# Patient Record
Sex: Female | Born: 2000 | Race: Asian | Hispanic: No | Marital: Single | Smoking: Never smoker
Health system: Southern US, Community
[De-identification: ages and names within clinical notes are randomized; demographics above are authoritative.]

## PROBLEM LIST (undated history)

## (undated) DIAGNOSIS — S42009A Fracture of unspecified part of unspecified clavicle, initial encounter for closed fracture: Secondary | ICD-10-CM

---
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
## 2019-06-13 ENCOUNTER — Other Ambulatory Visit: Payer: Self-pay

## 2019-06-13 ENCOUNTER — Observation Stay
Admission: EM | Admit: 2019-06-13 | Discharge: 2019-06-15 | Disposition: A | Discharge status: Home / Self Care | Payer: BC Managed Care – PPO | Attending: General Surgery | Admitting: General Surgery

## 2019-06-13 DIAGNOSIS — K3533 Acute appendicitis with perforation and localized peritonitis, with abscess: Secondary | ICD-10-CM | POA: Diagnosis not present

## 2019-06-13 DIAGNOSIS — R1031 Right lower quadrant pain: Secondary | ICD-10-CM

## 2019-06-13 DIAGNOSIS — Z20828 Contact with and (suspected) exposure to other viral communicable diseases: Secondary | ICD-10-CM | POA: Insufficient documentation

## 2019-06-13 DIAGNOSIS — R109 Unspecified abdominal pain: Secondary | ICD-10-CM | POA: Diagnosis present

## 2019-06-13 DIAGNOSIS — K3580 Unspecified acute appendicitis: Secondary | ICD-10-CM | POA: Diagnosis present

## 2019-06-13 NOTE — ED Triage Notes (Signed)
Patient states that she has had abdominal pain since 8am today.

## 2019-06-14 ENCOUNTER — Other Ambulatory Visit: Payer: Self-pay

## 2019-06-14 ENCOUNTER — Encounter
Admission: EM | Disposition: A | Discharge status: Home / Self Care | Payer: Self-pay | Source: Home / Self Care | Attending: Emergency Medicine

## 2019-06-14 ENCOUNTER — Encounter: Payer: Self-pay | Admitting: Radiology

## 2019-06-14 ENCOUNTER — Emergency Department: Payer: BC Managed Care – PPO

## 2019-06-14 ENCOUNTER — Observation Stay: Payer: BC Managed Care – PPO | Admitting: Anesthesiology

## 2019-06-14 DIAGNOSIS — K3580 Unspecified acute appendicitis: Secondary | ICD-10-CM | POA: Diagnosis present

## 2019-06-14 HISTORY — PX: LAPAROSCOPIC APPENDECTOMY: SHX408

## 2019-06-14 LAB — COMPREHENSIVE METABOLIC PANEL
ALT: 12 U/L (ref 0–44)
AST: 19 U/L (ref 15–41)
Albumin: 4.1 g/dL (ref 3.5–5.0)
Alkaline Phosphatase: 85 U/L (ref 38–126)
Anion gap: 10 (ref 5–15)
BUN: 8 mg/dL (ref 6–20)
CO2: 24 mmol/L (ref 22–32)
Calcium: 9.6 mg/dL (ref 8.9–10.3)
Chloride: 103 mmol/L (ref 98–111)
Creatinine, Ser: 0.56 mg/dL (ref 0.44–1.00)
GFR calc Af Amer: >60 mL/min (ref >60)
GFR calc non Af Amer: >60 mL/min (ref >60)
Glucose, Bld: 108 mg/dL — ABNORMAL HIGH (ref 70–99)
Potassium: 3.6 mmol/L (ref 3.5–5.1)
Sodium: 137 mmol/L (ref 135–145)
Total Bilirubin: 0.5 mg/dL (ref 0.3–1.2)
Total Protein: 8 g/dL (ref 6.5–8.1)

## 2019-06-14 LAB — CBC WITH DIFFERENTIAL/PLATELET
Abs Immature Granulocytes: 0.03 10*3/uL (ref 0.00–0.07)
Basophils Absolute: 0.1 10*3/uL (ref 0.0–0.1)
Basophils Relative: 0 %
Eosinophils Absolute: 0.2 10*3/uL (ref 0.0–0.5)
Eosinophils Relative: 2 %
HCT: 41 % (ref 36.0–46.0)
Hemoglobin: 14 g/dL (ref 12.0–15.0)
Immature Granulocytes: 0 %
Lymphocytes Relative: 20 %
Lymphs Abs: 2.2 10*3/uL (ref 0.7–4.0)
MCH: 29.9 pg (ref 26.0–34.0)
MCHC: 34.1 g/dL (ref 30.0–36.0)
MCV: 87.4 fL (ref 80.0–100.0)
Monocytes Absolute: 0.7 10*3/uL (ref 0.1–1.0)
Monocytes Relative: 6 %
Neutro Abs: 7.9 10*3/uL — ABNORMAL HIGH (ref 1.7–7.7)
Neutrophils Relative %: 72 %
Platelets: 250 10*3/uL (ref 150–400)
RBC: 4.69 MIL/uL (ref 3.87–5.11)
RDW: 12.1 % (ref 11.5–15.5)
WBC: 11.2 10*3/uL — ABNORMAL HIGH (ref 4.0–10.5)
nRBC: 0 % (ref 0.0–0.2)

## 2019-06-14 LAB — URINALYSIS, COMPLETE (UACMP) WITH MICROSCOPIC
Bacteria, UA: NONE SEEN
Bilirubin Urine: NEGATIVE
Glucose, UA: NEGATIVE mg/dL
Hgb urine dipstick: NEGATIVE
Ketones, ur: NEGATIVE mg/dL
Leukocytes,Ua: NEGATIVE
Nitrite: NEGATIVE
Protein, ur: NEGATIVE mg/dL
Specific Gravity, Urine: 1.012 (ref 1.005–1.030)
pH: 6 (ref 5.0–8.0)

## 2019-06-14 LAB — LIPASE, BLOOD: Lipase: 24 U/L (ref 11–51)

## 2019-06-14 LAB — HCG, QUANTITATIVE, PREGNANCY: hCG, Beta Chain, Quant, S: <1 m[IU]/mL (ref <5)

## 2019-06-14 LAB — SARS CORONAVIRUS 2 BY RT PCR (HOSPITAL ORDER, PERFORMED IN ~~LOC~~ HOSPITAL LAB): SARS Coronavirus 2: NEGATIVE

## 2019-06-14 IMAGING — CT CT ABD-PELV W/ CM
2 of 4 series · 16 of 46 positions shown, 18 images · IV contrast (APPLIED)
Comparison: None.

CLINICAL DATA: Abdominal pain since 8 a.m.

EXAM:
CT ABDOMEN AND PELVIS WITH CONTRAST
TECHNIQUE: Multidetector CT imaging of the abdomen and pelvis was performed
using the standard protocol following bolus administration of
intravenous contrast.
CONTRAST:  100mL OMNIPAQUE IOHEXOL 300 MG/ML  SOLN

[Series 2: routine abd/pel with · axial · 0.74mm/px · z∈[-1127,-712]mm · 13 of 91 slices shown, 15 images]
[im 4/91  soft-tissue]
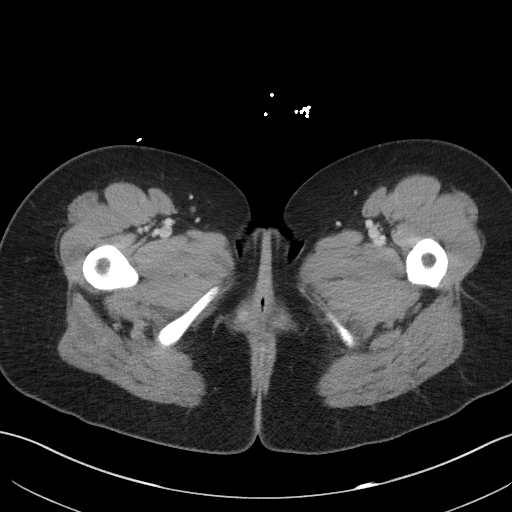
[im 4/91  bone]
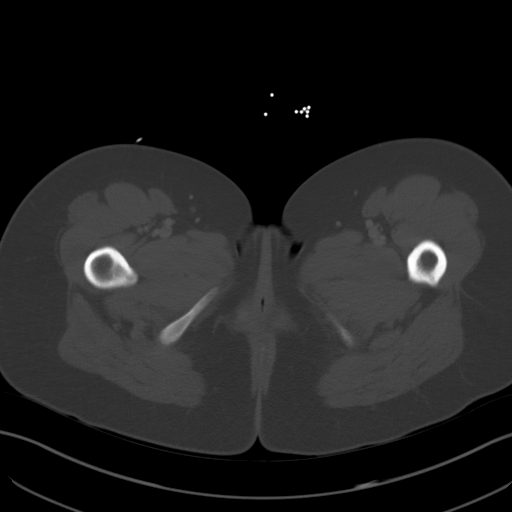
[im 12/91  soft-tissue]
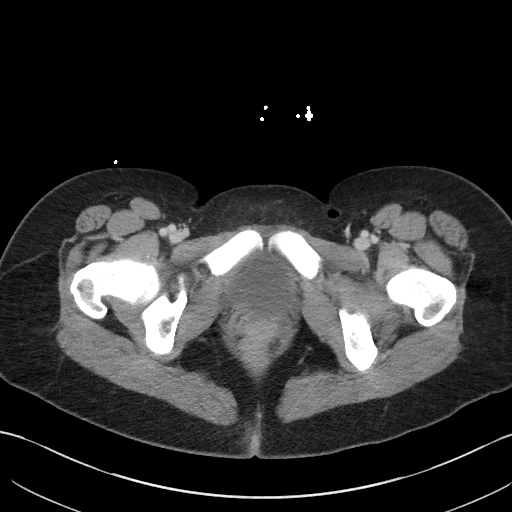
[im 19/91  soft-tissue]
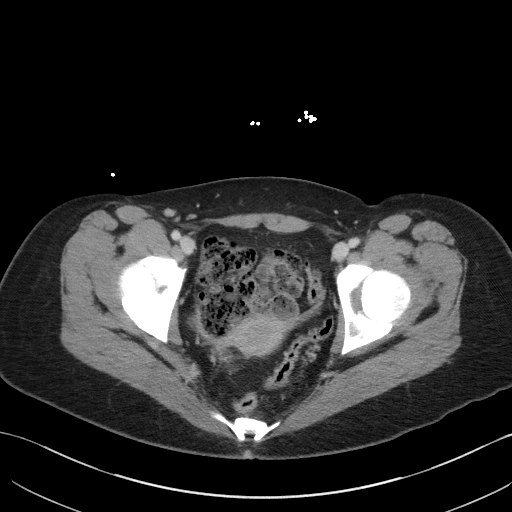
[im 27/91  soft-tissue]
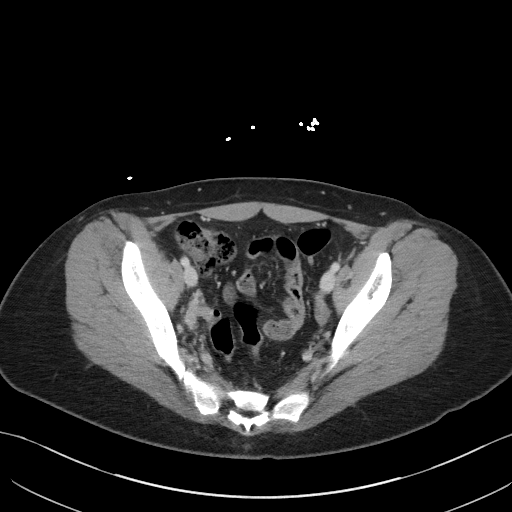
[im 31/91  soft-tissue]
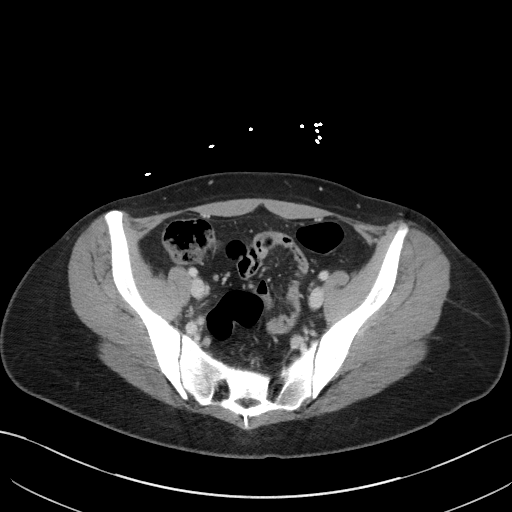
[im 38/91  soft-tissue]
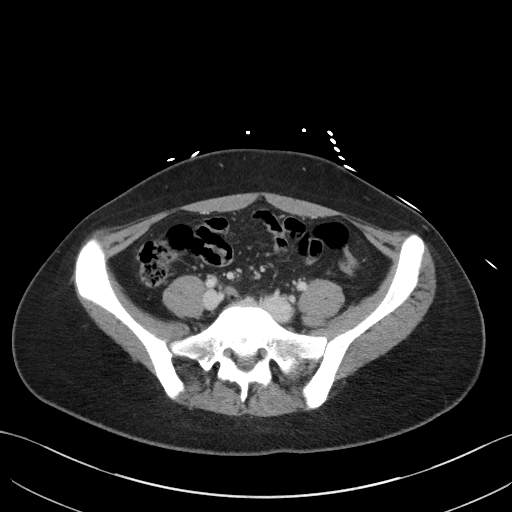
[im 46/91  soft-tissue]
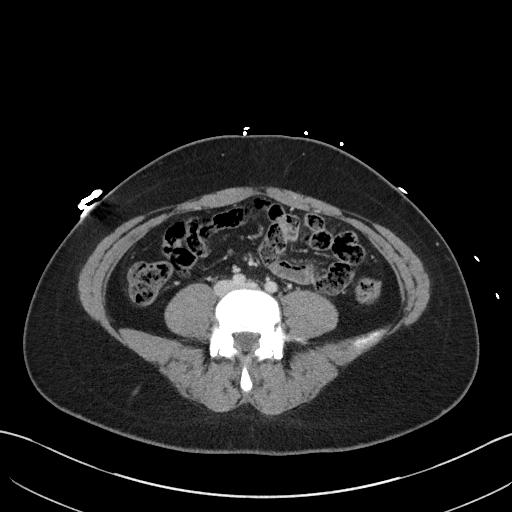
[im 53/91  soft-tissue]
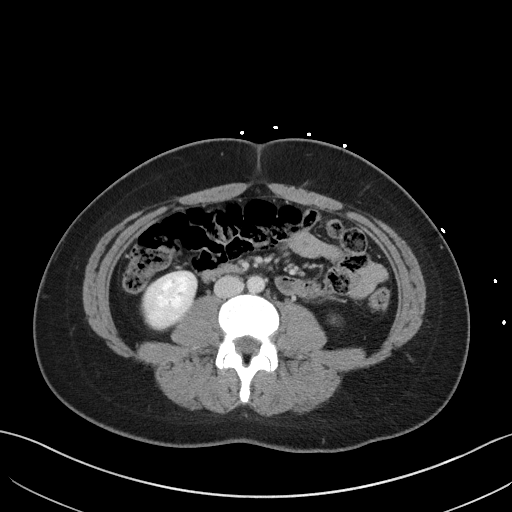
[im 61/91  soft-tissue]
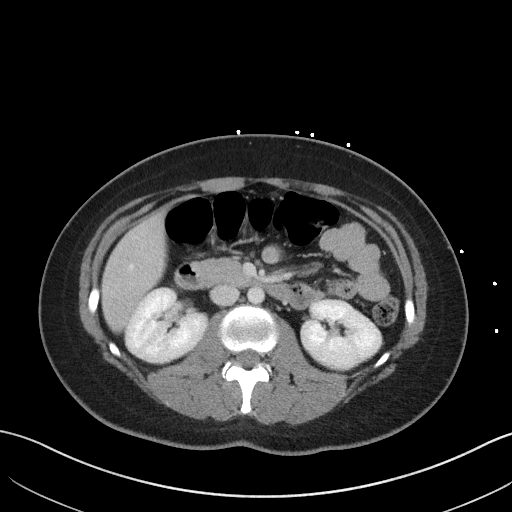
[im 61/91  bone]
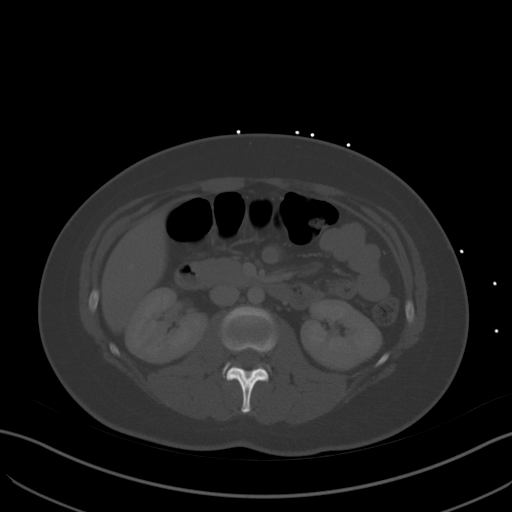
[im 64/91  soft-tissue]
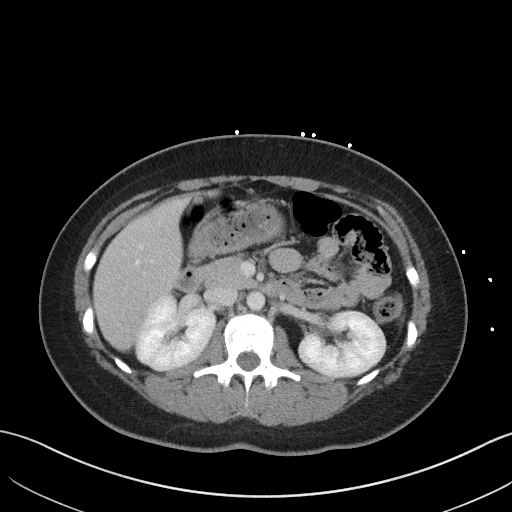
[im 72/91  soft-tissue]
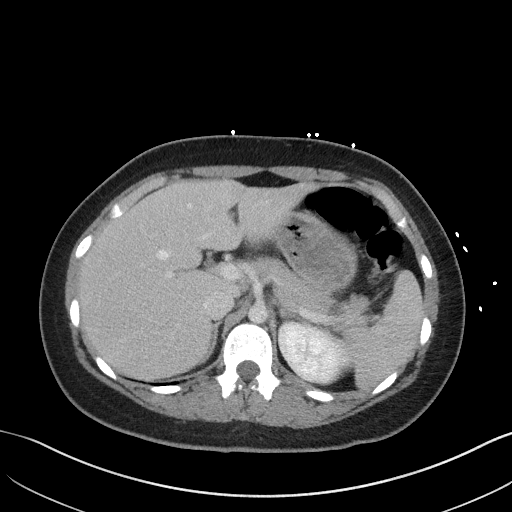
[im 79/91  soft-tissue]
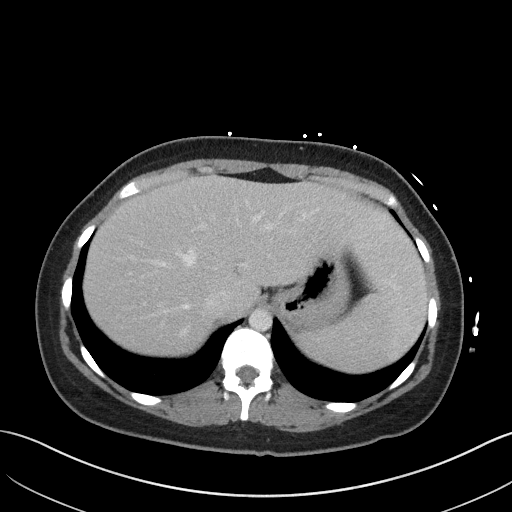
[im 87/91  soft-tissue]
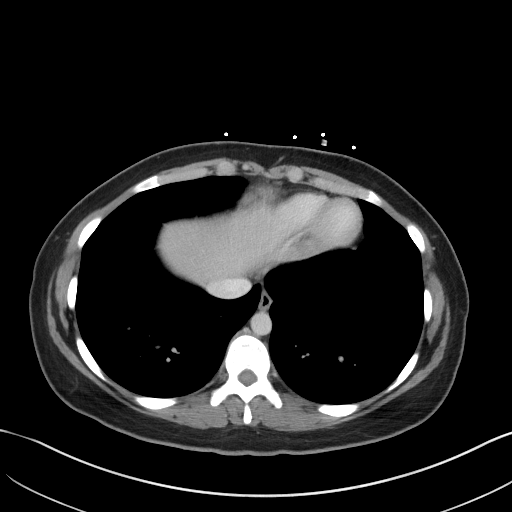

[Series 5: coronal st · coronal · 0.74mm/px · 3 of 79 slices shown]
[im 27/79  soft-tissue]
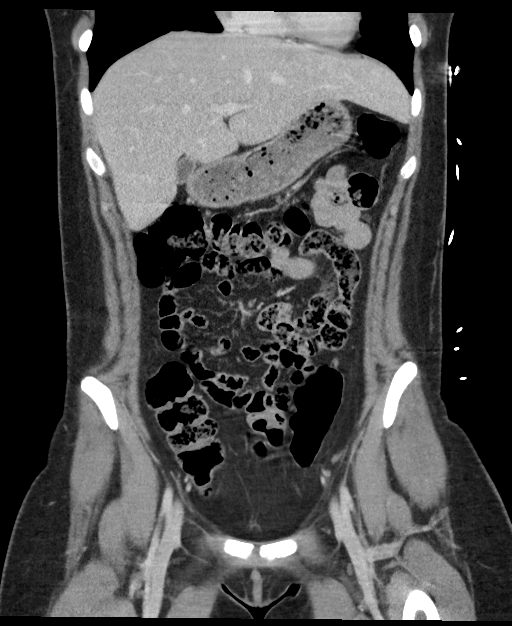
[im 35/79  soft-tissue]
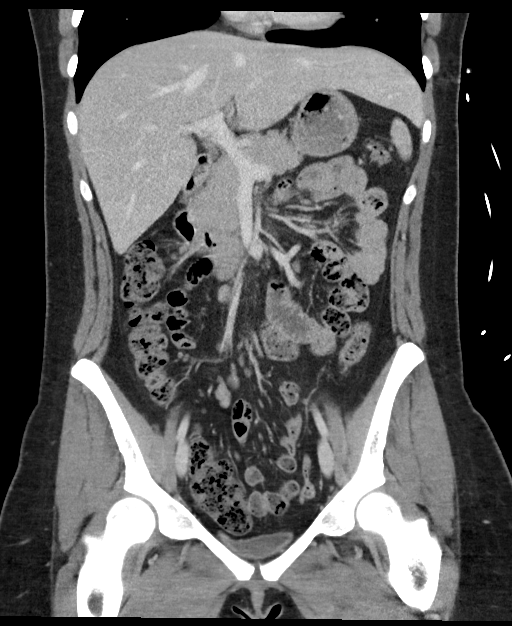
[im 44/79  soft-tissue]
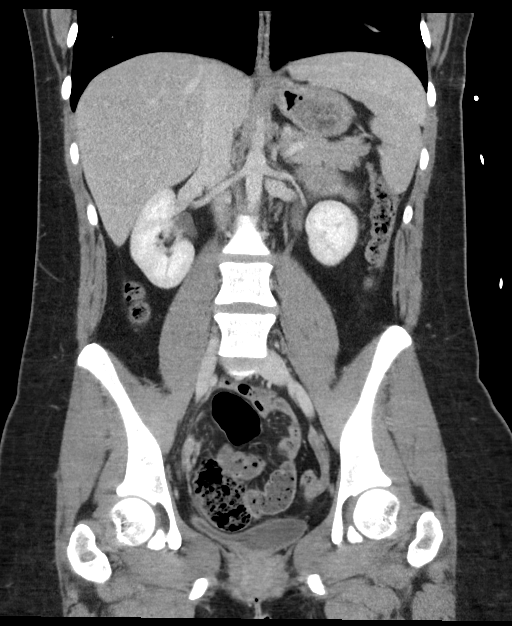

[16 of 46 positions shown; findings below may reference images not displayed]

FINDINGS: Lower chest: Lung bases are clear.

Hepatobiliary: No focal hepatic lesion. No biliary duct dilatation.
Gallbladder is normal. Common bile duct is normal.

Pancreas: Pancreas is normal. No ductal dilatation. No pancreatic
inflammation.

Spleen: Normal spleen

Adrenals/urinary tract: Adrenal glands and kidneys are normal. The
ureters and bladder normal.

Stomach/Bowel: Stomach, duodenum small-bowel normal. Cecum extends
into the RIGHT pelvis. Appendix difficult to identify but there is
no peri cecal inflammation.

The ascending, transverse and descending colon normal.

Vascular/Lymphatic: Abdominal aorta is normal caliber. No periportal
or retroperitoneal adenopathy. No pelvic adenopathy.

Reproductive: Uterus and ovaries normal.  No free fluid.

Other: No free fluid.

Musculoskeletal: No aggressive osseous lesion.
IMPRESSION: 1. No acute abdominopelvic findings identified.
2. While the appendix is not clearly identified, no pericecal
inflammation present. Cecum extends into the pelvis. Recommend
repeat exam if patient's symptoms worsen.

## 2019-06-14 SURGERY — APPENDECTOMY, LAPAROSCOPIC
Anesthesia: General

## 2019-06-14 MED ORDER — ONDANSETRON HCL 4 MG/2ML IJ SOLN
INTRAMUSCULAR | Status: AC
  Filled 2019-06-14: qty 2

## 2019-06-14 MED ORDER — LIDOCAINE HCL (PF) 2 % IJ SOLN
INTRAMUSCULAR | Status: AC
  Filled 2019-06-14: qty 10

## 2019-06-14 MED ORDER — MIDAZOLAM HCL 2 MG/2ML IJ SOLN
INTRAMUSCULAR | Status: DC | PRN
  Administered 2019-06-14: 2 mg via INTRAVENOUS

## 2019-06-14 MED ORDER — PIPERACILLIN-TAZOBACTAM 3.375 G IVPB 30 MIN
3.3750 g | Freq: Once | INTRAVENOUS | Status: AC
  Administered 2019-06-14: 3.375 g via INTRAVENOUS
  Filled 2019-06-14: qty 50

## 2019-06-14 MED ORDER — DEXAMETHASONE SODIUM PHOSPHATE 10 MG/ML IJ SOLN
INTRAMUSCULAR | Status: AC
  Filled 2019-06-14: qty 1

## 2019-06-14 MED ORDER — PROPOFOL 10 MG/ML IV BOLUS
INTRAVENOUS | Status: DC | PRN
  Administered 2019-06-14: 140 mg via INTRAVENOUS

## 2019-06-14 MED ORDER — LIDOCAINE-EPINEPHRINE (PF) 1 %-1:200000 IJ SOLN
INTRAMUSCULAR | Status: AC
  Filled 2019-06-14: qty 30

## 2019-06-14 MED ORDER — KETOROLAC TROMETHAMINE 30 MG/ML IJ SOLN
INTRAMUSCULAR | Status: AC
  Filled 2019-06-14: qty 1

## 2019-06-14 MED ORDER — BUPIVACAINE HCL (PF) 0.25 % IJ SOLN
INTRAMUSCULAR | Status: AC
  Filled 2019-06-14: qty 30

## 2019-06-14 MED ORDER — SIMETHICONE 80 MG PO CHEW
40.0000 mg | CHEWABLE_TABLET | Freq: Four times a day (QID) | ORAL | Status: DC | PRN
  Filled 2019-06-14: qty 1

## 2019-06-14 MED ORDER — ACETAMINOPHEN 325 MG PO TABS: 650.0000 mg | ORAL_TABLET | Freq: Four times a day (QID) | ORAL | Status: DC | PRN

## 2019-06-14 MED ORDER — FENTANYL CITRATE (PF) 250 MCG/5ML IJ SOLN
INTRAMUSCULAR | Status: AC
  Filled 2019-06-14: qty 5

## 2019-06-14 MED ORDER — SUGAMMADEX SODIUM 200 MG/2ML IV SOLN
INTRAVENOUS | Status: DC | PRN
  Administered 2019-06-14: 300 mg via INTRAVENOUS

## 2019-06-14 MED ORDER — DEXMEDETOMIDINE HCL 200 MCG/2ML IV SOLN
INTRAVENOUS | Status: DC | PRN
  Administered 2019-06-14: 8 ug via INTRAVENOUS
  Administered 2019-06-14: 4 ug via INTRAVENOUS
  Administered 2019-06-14: 8 ug via INTRAVENOUS

## 2019-06-14 MED ORDER — LIDOCAINE HCL (CARDIAC) PF 100 MG/5ML IV SOSY
PREFILLED_SYRINGE | INTRAVENOUS | Status: DC | PRN
  Administered 2019-06-14: 80 mg via INTRAVENOUS

## 2019-06-14 MED ORDER — HYDROMORPHONE HCL 1 MG/ML IJ SOLN
0.5000 mg | INTRAMUSCULAR | Status: DC | PRN
  Administered 2019-06-14 – 2019-06-15 (×4): 0.5 mg via INTRAVENOUS
  Filled 2019-06-14 (×4): qty 0.5

## 2019-06-14 MED ORDER — KETOROLAC TROMETHAMINE 30 MG/ML IJ SOLN
INTRAMUSCULAR | Status: DC | PRN
  Administered 2019-06-14: 30 mg via INTRAVENOUS

## 2019-06-14 MED ORDER — SODIUM CHLORIDE 0.9 % IV BOLUS
1000.0000 mL | Freq: Once | INTRAVENOUS | Status: AC
  Administered 2019-06-14: 1000 mL via INTRAVENOUS

## 2019-06-14 MED ORDER — DEXAMETHASONE SODIUM PHOSPHATE 10 MG/ML IJ SOLN
INTRAMUSCULAR | Status: DC | PRN
  Administered 2019-06-14: 10 mg via INTRAVENOUS

## 2019-06-14 MED ORDER — DEXMEDETOMIDINE HCL IN NACL 80 MCG/20ML IV SOLN
INTRAVENOUS | Status: AC
  Filled 2019-06-14: qty 20

## 2019-06-14 MED ORDER — ONDANSETRON 4 MG PO TBDP: 4.0000 mg | ORAL_TABLET | Freq: Four times a day (QID) | ORAL | Status: DC | PRN

## 2019-06-14 MED ORDER — PROMETHAZINE HCL 25 MG/ML IJ SOLN: 6.2500 mg | INTRAMUSCULAR | Status: DC | PRN

## 2019-06-14 MED ORDER — SUCCINYLCHOLINE CHLORIDE 20 MG/ML IJ SOLN
INTRAMUSCULAR | Status: DC | PRN
  Administered 2019-06-14: 120 mg via INTRAVENOUS

## 2019-06-14 MED ORDER — ONDANSETRON HCL 4 MG/2ML IJ SOLN
INTRAMUSCULAR | Status: DC | PRN
  Administered 2019-06-14: 4 mg via INTRAVENOUS

## 2019-06-14 MED ORDER — DIPHENHYDRAMINE HCL 50 MG/ML IJ SOLN
INTRAMUSCULAR | Status: AC
  Filled 2019-06-14: qty 1

## 2019-06-14 MED ORDER — PROPOFOL 10 MG/ML IV BOLUS
INTRAVENOUS | Status: AC
  Filled 2019-06-14: qty 20

## 2019-06-14 MED ORDER — LACTATED RINGERS IV SOLN
INTRAVENOUS | Status: DC | PRN
  Administered 2019-06-14: 11:00:00 via INTRAVENOUS

## 2019-06-14 MED ORDER — MIDAZOLAM HCL 2 MG/2ML IJ SOLN
INTRAMUSCULAR | Status: AC
  Filled 2019-06-14: qty 2

## 2019-06-14 MED ORDER — PIPERACILLIN-TAZOBACTAM 3.375 G IVPB: 3.3750 g | Freq: Three times a day (TID) | INTRAVENOUS | Status: DC

## 2019-06-14 MED ORDER — SUGAMMADEX SODIUM 200 MG/2ML IV SOLN
INTRAVENOUS | Status: AC
  Filled 2019-06-14: qty 2

## 2019-06-14 MED ORDER — ACETAMINOPHEN 650 MG RE SUPP: 650.0000 mg | Freq: Four times a day (QID) | RECTAL | Status: DC | PRN

## 2019-06-14 MED ORDER — OXYCODONE HCL 5 MG PO TABS: 5.0000 mg | ORAL_TABLET | Freq: Once | ORAL | Status: DC | PRN

## 2019-06-14 MED ORDER — IOHEXOL 300 MG/ML  SOLN
100.0000 mL | Freq: Once | INTRAMUSCULAR | Status: AC | PRN
  Administered 2019-06-14: 100 mL via INTRAVENOUS

## 2019-06-14 MED ORDER — KETOROLAC TROMETHAMINE 30 MG/ML IJ SOLN
15.0000 mg | Freq: Once | INTRAMUSCULAR | Status: AC
  Administered 2019-06-14: 15 mg via INTRAVENOUS
  Filled 2019-06-14: qty 1

## 2019-06-14 MED ORDER — FENTANYL CITRATE (PF) 100 MCG/2ML IJ SOLN: 25.0000 ug | INTRAMUSCULAR | Status: DC | PRN

## 2019-06-14 MED ORDER — ONDANSETRON HCL 4 MG/2ML IJ SOLN: 4.0000 mg | Freq: Four times a day (QID) | INTRAMUSCULAR | Status: DC | PRN

## 2019-06-14 MED ORDER — OXYCODONE HCL 5 MG/5ML PO SOLN: 5.0000 mg | Freq: Once | ORAL | Status: DC | PRN

## 2019-06-14 MED ORDER — FENTANYL CITRATE (PF) 100 MCG/2ML IJ SOLN
INTRAMUSCULAR | Status: DC | PRN
  Administered 2019-06-14: 100 ug via INTRAVENOUS
  Administered 2019-06-14: 50 ug via INTRAVENOUS
  Administered 2019-06-14: 100 ug via INTRAVENOUS

## 2019-06-14 MED ORDER — KETOROLAC TROMETHAMINE 30 MG/ML IJ SOLN: 30.0000 mg | Freq: Four times a day (QID) | INTRAMUSCULAR | Status: DC | PRN

## 2019-06-14 MED ORDER — LIDOCAINE-EPINEPHRINE (PF) 1 %-1:200000 IJ SOLN
INTRAMUSCULAR | Status: DC | PRN
  Administered 2019-06-14: 18 mL

## 2019-06-14 MED ORDER — ONDANSETRON HCL 4 MG/2ML IJ SOLN
4.0000 mg | Freq: Once | INTRAMUSCULAR | Status: AC
  Administered 2019-06-14: 4 mg via INTRAVENOUS
  Filled 2019-06-14: qty 2

## 2019-06-14 MED ORDER — SUCCINYLCHOLINE CHLORIDE 20 MG/ML IJ SOLN
INTRAMUSCULAR | Status: AC
  Filled 2019-06-14: qty 1

## 2019-06-14 MED ORDER — ROCURONIUM BROMIDE 100 MG/10ML IV SOLN
INTRAVENOUS | Status: DC | PRN
  Administered 2019-06-14: 40 mg via INTRAVENOUS

## 2019-06-14 MED ORDER — ACETAMINOPHEN 500 MG PO TABS
1000.0000 mg | ORAL_TABLET | Freq: Four times a day (QID) | ORAL | Status: DC
  Administered 2019-06-14 – 2019-06-15 (×3): 1000 mg via ORAL
  Administered 2019-06-15: 500 mg via ORAL
  Filled 2019-06-14 (×4): qty 2

## 2019-06-14 MED ORDER — ROCURONIUM BROMIDE 50 MG/5ML IV SOLN
INTRAVENOUS | Status: AC
  Filled 2019-06-14: qty 1

## 2019-06-14 MED ORDER — DEXTROSE IN LACTATED RINGERS 5 % IV SOLN
INTRAVENOUS | Status: DC
  Administered 2019-06-14: via INTRAVENOUS

## 2019-06-14 SURGICAL SUPPLY — 52 items
APPLICATOR COTTON TIP 6 STRL (MISCELLANEOUS) ×1 IMPLANT
APPLICATOR COTTON TIP 6IN STRL (MISCELLANEOUS) ×2
APPLIER CLIP 5 13 M/L LIGAMAX5 (MISCELLANEOUS)
BLADE SURG SZ11 CARB STEEL (BLADE) ×2 IMPLANT
CANISTER SUCT 1200ML W/VALVE (MISCELLANEOUS) ×2 IMPLANT
CHLORAPREP W/TINT 26 (MISCELLANEOUS) ×2 IMPLANT
CLIP APPLIE 5 13 M/L LIGAMAX5 (MISCELLANEOUS) IMPLANT
COVER WAND RF STERILE (DRAPES) ×2 IMPLANT
CUTTER FLEX LINEAR 45M (STAPLE) ×2 IMPLANT
DEFOGGER SCOPE WARMER CLEARIFY (MISCELLANEOUS) ×2 IMPLANT
DERMABOND ADVANCED (GAUZE/BANDAGES/DRESSINGS) ×1
DERMABOND ADVANCED .7 DNX12 (GAUZE/BANDAGES/DRESSINGS) ×1 IMPLANT
ELECT CAUTERY BLADE 6.4 (BLADE) ×2 IMPLANT
ELECT CAUTERY BLADE TIP 2.5 (TIP) ×2
ELECT REM PT RETURN 9FT ADLT (ELECTROSURGICAL) ×2
ELECTRODE CAUTERY BLDE TIP 2.5 (TIP) ×1 IMPLANT
ELECTRODE REM PT RTRN 9FT ADLT (ELECTROSURGICAL) ×1 IMPLANT
GLOVE BIO SURGEON STRL SZ 6.5 (GLOVE) ×6 IMPLANT
GLOVE INDICATOR 7.0 STRL GRN (GLOVE) ×4 IMPLANT
GOWN STRL REUS W/ TWL LRG LVL3 (GOWN DISPOSABLE) ×2 IMPLANT
GOWN STRL REUS W/TWL LRG LVL3 (GOWN DISPOSABLE) ×2
GRASPER SUT TROCAR 14GX15 (MISCELLANEOUS) ×2 IMPLANT
IRRIGATION STRYKERFLOW (MISCELLANEOUS) ×1 IMPLANT
IRRIGATOR STRYKERFLOW (MISCELLANEOUS) ×2
IV NS 1000ML (IV SOLUTION) ×1
IV NS 1000ML BAXH (IV SOLUTION) ×1 IMPLANT
KIT TURNOVER KIT A (KITS) ×2 IMPLANT
KITTNER LAPARASCOPIC 5X40 (MISCELLANEOUS) IMPLANT
LABEL OR SOLS (LABEL) ×2 IMPLANT
LIGASURE LAP MARYLAND 5MM 37CM (ELECTROSURGICAL) IMPLANT
NEEDLE HYPO 22GX1.5 SAFETY (NEEDLE) ×2 IMPLANT
NS IRRIG 500ML POUR BTL (IV SOLUTION) ×2 IMPLANT
PACK LAP CHOLECYSTECTOMY (MISCELLANEOUS) ×2 IMPLANT
PENCIL ELECTRO HAND CTR (MISCELLANEOUS) ×2 IMPLANT
POUCH SPECIMEN RETRIEVAL 10MM (ENDOMECHANICALS) ×2 IMPLANT
RELOAD 45 VASCULAR/THIN (ENDOMECHANICALS) IMPLANT
RELOAD STAPLE TA45 3.5 REG BLU (ENDOMECHANICALS) ×2 IMPLANT
SCISSORS METZENBAUM CVD 33 (INSTRUMENTS) ×2 IMPLANT
SET TUBE SMOKE EVAC HIGH FLOW (TUBING) ×2 IMPLANT
SHEARS HARMONIC ACE PLUS 36CM (ENDOMECHANICALS) ×2 IMPLANT
SLEEVE ADV FIXATION 5X100MM (TROCAR) ×4 IMPLANT
STRIP CLOSURE SKIN 1/2X4 (GAUZE/BANDAGES/DRESSINGS) ×2 IMPLANT
SUT MNCRL 4-0 (SUTURE) ×1
SUT MNCRL 4-0 27XMFL (SUTURE) ×1
SUT VIC AB 3-0 SH 27 (SUTURE) ×1
SUT VIC AB 3-0 SH 27X BRD (SUTURE) ×1 IMPLANT
SUT VICRYL 0 AB UR-6 (SUTURE) ×2 IMPLANT
SUTURE MNCRL 4-0 27XMF (SUTURE) ×1 IMPLANT
TRAY FOLEY MTR SLVR 16FR STAT (SET/KITS/TRAYS/PACK) ×2 IMPLANT
TROCAR 130MM GELPORT  DAV (MISCELLANEOUS) ×2 IMPLANT
TROCAR ADV FIXATION 12X100MM (TROCAR) IMPLANT
TROCAR Z-THREAD OPTICAL 5X100M (TROCAR) ×2 IMPLANT

## 2019-06-14 NOTE — Anesthesia Preprocedure Evaluation (Addendum)
Anesthesia Evaluation  Patient identified by MRN, date of birth, ID band Patient awake    Reviewed: Allergy & Precautions, H&P , NPO status , Patient's Chart, lab work & pertinent test results  History of Anesthesia Complications Negative for: history of anesthetic complications  Airway Mallampati: II  TM Distance: >3 FB Neck ROM: full    Dental  (+) Chipped   Pulmonary neg pulmonary ROS, neg shortness of breath,           Cardiovascular Exercise Tolerance: Good (-) angina(-) Past MI and (-) DOE negative cardio ROS       Neuro/Psych negative neurological ROS  negative psych ROS   GI/Hepatic negative GI ROS, Neg liver ROS, neg GERD  ,  Endo/Other  negative endocrine ROS  Renal/GU      Musculoskeletal   Abdominal   Peds  Hematology negative hematology ROS (+)   Anesthesia Other Findings Past Medical History: 03/2015: Broken collarbone  History reviewed. No pertinent surgical history.  BMI    Body Mass Index: 24.89 kg/m      Reproductive/Obstetrics negative OB ROS                             Anesthesia Physical Anesthesia Plan  ASA: I  Anesthesia Plan: General ETT, Rapid Sequence and Cricoid Pressure   Post-op Pain Management:    Induction: Intravenous  PONV Risk Score and Plan: Ondansetron, Dexamethasone, Midazolam and Treatment may vary due to age or medical condition  Airway Management Planned: Oral ETT  Additional Equipment:   Intra-op Plan:   Post-operative Plan: Extubation in OR  Informed Consent: I have reviewed the patients History and Physical, chart, labs and discussed the procedure including the risks, benefits and alternatives for the proposed anesthesia with the patient or authorized representative who has indicated his/her understanding and acceptance.     Dental Advisory Given  Plan Discussed with: Anesthesiologist, CRNA and Surgeon  Anesthesia  Plan Comments: (Patient consented for risks of anesthesia including but not limited to:  - adverse reactions to medications - damage to teeth, lips or other oral mucosa - sore throat or hoarseness - Damage to heart, brain, lungs or loss of life  Patient voiced understanding.)        Anesthesia Quick Evaluation

## 2019-06-14 NOTE — ED Notes (Signed)
Flu swab sent to lab

## 2019-06-14 NOTE — ED Provider Notes (Addendum)
[facility] Emergency Department Provider Note  ____________________________________________  Time seen: Approximately 2:07 AM  I have reviewed the triage vital signs and the nursing notes.   HISTORY  Chief Complaint Abdominal Pain   HPI Melissa Santos is a 18 y.o. female no significant past medical history who presents for evaluation of right lower quadrant abdominal pain.  Pain has been constant, dull with intermittent episodes of sharp stabbing pain located in the right lower quadrant associated with nausea, one episode of nonbloody nonbilious emesis.  No vaginal discharge.  Patient has never been sexually active.  No vaginal bleeding.  She reports that she takes continuous OCPs therefore she has not had a menstrual period in a while.  She has had urinary frequency and dysuria over the last 24 hours.  No flank pain.  She took Tylenol at home with no significant relief.  No diarrhea, no constipation, no prior abdominal surgeries. Tmax of 42F at home.  Past Medical History:  Diagnosis Date   Broken collarbone 03/2015    There are no active problems to display for this patient.   History reviewed. No pertinent surgical history.  Prior to Admission medications   Not on File    Allergies Patient has no allergy information on record.  History reviewed. No pertinent family history.  Social History Social History   Tobacco Use   Smoking status: Never Smoker   Smokeless tobacco: Never Used  Substance Use Topics   Alcohol use: Never    Frequency: Never   Drug use: Never    Review of Systems  Constitutional: Negative for fever. Eyes: Negative for visual changes. ENT: Negative for sore throat. Neck: No neck pain  Cardiovascular: Negative for chest pain. Respiratory: Negative for shortness of breath. Gastrointestinal: + RLQ abdominal pain, nausea, and vomiting. No diarrhea. Genitourinary: Negative for dysuria. Musculoskeletal: Negative  for back pain. Skin: Negative for rash. Neurological: Negative for headaches, weakness or numbness. Psych: No SI or HI  ____________________________________________   PHYSICAL EXAM:  VITAL SIGNS: ED Triage Vitals  Enc Vitals Group     BP 06/13/19 2334 127/79     Pulse Rate 06/13/19 2334 97     Resp 06/13/19 2334 (!) 21     Temp 06/13/19 2334 98.2 F (36.8 C)     Temp src --      SpO2 06/13/19 2334 100 %     Weight 06/13/19 2345 145 lb (65.8 kg)     Height 06/13/19 2345 5\' 4"  (1.626 m)     Head Circumference --      Peak Flow --      Pain Score 06/13/19 2344 5     Pain Loc --      Pain Edu? --      Excl. in [place]? --     Constitutional: Alert and oriented. Well appearing and in no apparent distress. HEENT:      Head: Normocephalic and atraumatic.         Eyes: Conjunctivae are normal. Sclera is non-icteric.       Mouth/Throat: Mucous membranes are moist.       Neck: Supple with no signs of meningismus. Cardiovascular: Regular rate and rhythm. No murmurs, gallops, or rubs. 2+ symmetrical distal pulses are present in all extremities. No JVD. Respiratory: Normal respiratory effort. Lungs are clear to auscultation bilaterally. No wheezes, crackles, or rhonchi.  Gastrointestinal: Soft, tenderness palpation in the RLQ, and non distended with positive bowel sounds. No rebound or guarding.  Genitourinary: No CVA tenderness. Musculoskeletal: Nontender with normal range of motion in all extremities. No edema, cyanosis, or erythema of extremities. Neurologic: Normal speech and language. Face is symmetric. Moving all extremities. No gross focal neurologic deficits are appreciated. Skin: Skin is warm, dry and intact. No rash noted. Psychiatric: Mood and affect are normal. Speech and behavior are normal.  ____________________________________________   LABS (all labs ordered are listed, but only abnormal results are displayed)  Labs Reviewed  CBC WITH DIFFERENTIAL/PLATELET - Abnormal;  Notable for the following components:      Result Value   WBC 11.2 (*)    Neutro Abs 7.9 (*)    All other components within normal limits  COMPREHENSIVE METABOLIC PANEL - Abnormal; Notable for the following components:   Glucose, Bld 108 (*)    All other components within normal limits  URINALYSIS, COMPLETE (UACMP) WITH MICROSCOPIC - Abnormal; Notable for the following components:   Color, Urine STRAW (*)    APPearance CLEAR (*)    All other components within normal limits  SARS CORONAVIRUS 2 BY RT PCR (HOSPITAL ORDER, PERFORMED IN [facility])  HCG, QUANTITATIVE, PREGNANCY  LIPASE, BLOOD   ____________________________________________  EKG  none  ____________________________________________  RADIOLOGY  I have personally reviewed the images performed during this visit and I agree with the Radiologist's read.   Interpretation by Radiologist:  US Pelvis (transabdominal Only)  Result Date: 06/14/2019 CLINICAL DATA:  Right lower quadrant pain EXAM: TRANSABDOMINAL ULTRASOUND OF PELVIS DOPPLER ULTRASOUND OF OVARIES TECHNIQUE: Transabdominal ultrasound examination of the pelvis was performed. Transabdominal technique was performed for global imaging of the pelvis including uterus, ovaries, adnexal regions, and pelvic cul-de-sac. Color and duplex Doppler ultrasound was utilized to evaluate blood flow to the ovaries. COMPARISON:  None. FINDINGS: Uterus Measurements: 6.9 x 2.6 x 3.5 cm = volume: 33.4 mL. No fibroids or other mass visualized. Endometrium Thickness: 4.2 mm.  No focal abnormality visualized. Right ovary Measurements: 2.4 x 0.9 x 1.4 cm = volume: 1.6 mL. Normal appearance/no adnexal mass. Left ovary Measurements: 2.6 x 1.5 x 2.2 cm = volume: 4.5 mL. Normal appearance/no adnexal mass. Pulsed Doppler evaluation of both ovaries demonstrates normal low-resistance arterial and venous waveforms. Other findings No abnormal free fluid. Measurements: 6.9 x 2.6 x 3.5 cm = volume:  33.4 mL. Normal appearance/no adnexal mass. IMPRESSION: Normal exam. Electronically Signed   By: Katherine Mantle M.D.   On: 06/14/2019 03:21   Ct Abdomen Pelvis W Contrast  Result Date: 06/14/2019 CLINICAL DATA:  Abdominal pain since 8 a.m. EXAM: CT ABDOMEN AND PELVIS WITH CONTRAST TECHNIQUE: Multidetector CT imaging of the abdomen and pelvis was performed using the standard protocol following bolus administration of intravenous contrast. CONTRAST:  OMNIPAQUE IOHEXOL 300 MG/ML  SOLN COMPARISON:  None. FINDINGS: Lower chest: Lung bases are clear. Hepatobiliary: No focal hepatic lesion. No biliary duct dilatation. Gallbladder is normal. Common bile duct is normal. Pancreas: Pancreas is normal. No ductal dilatation. No pancreatic inflammation. Spleen: Normal spleen Adrenals/urinary tract: Adrenal glands and kidneys are normal. The ureters and bladder normal. Stomach/Bowel: Stomach, duodenum small-bowel normal. Cecum extends into the RIGHT pelvis. Appendix difficult to identify but there is no peri cecal inflammation. The ascending, transverse and descending colon normal. Vascular/Lymphatic: Abdominal aorta is normal caliber. No periportal or retroperitoneal adenopathy. No pelvic adenopathy. Reproductive: Uterus and ovaries normal.  No free fluid. Other: No free fluid. Musculoskeletal: No aggressive osseous lesion. IMPRESSION: 1. No acute abdominopelvic findings identified. 2. While the appendix is  not clearly identified, no pericecal inflammation present. Cecum extends into the pelvis. Recommend repeat exam if patient's symptoms worsen. Electronically Signed   By: Genevive BiStewart  Edmunds M.D.   On: 06/14/2019 04:22   [place] Pelvic Doppler (torsion R/o Or Mass Arterial Flow)  Result Date: 06/14/2019 CLINICAL DATA:  Right lower quadrant pain EXAM: TRANSABDOMINAL ULTRASOUND OF PELVIS DOPPLER ULTRASOUND OF OVARIES TECHNIQUE: Transabdominal ultrasound examination of the pelvis was performed. Transabdominal technique  was performed for global imaging of the pelvis including uterus, ovaries, adnexal regions, and pelvic cul-de-sac. Color and duplex Doppler ultrasound was utilized to evaluate blood flow to the ovaries. COMPARISON:  None. FINDINGS: Uterus Measurements: 6.9 x 2.6 x 3.5 cm = volume: 33.4 mL. No fibroids or other mass visualized. Endometrium Thickness: 4.2 mm.  No focal abnormality visualized. Right ovary Measurements: 2.4 x 0.9 x 1.4 cm = volume: 1.6 mL. Normal appearance/no adnexal mass. Left ovary Measurements: 2.6 x 1.5 x 2.2 cm = volume: 4.5 mL. Normal appearance/no adnexal mass. Pulsed Doppler evaluation of both ovaries demonstrates normal low-resistance arterial and venous waveforms. Other findings No abnormal free fluid. Measurements: 6.9 x 2.6 x 3.5 cm = volume: 33.4 mL. Normal appearance/no adnexal mass. IMPRESSION: Normal exam. Electronically Signed   By: Katherine Mantlehristopher  Green M.D.   On: 06/14/2019 03:21      ____________________________________________   PROCEDURES  Procedure(s) performed: None Procedures Critical Care performed:  None ____________________________________________   INITIAL IMPRESSION / ASSESSMENT AND PLAN / ED COURSE   18 y.o. female no significant past medical history who presents for evaluation of right lower quadrant abdominal pain, nausea, vomiting since earlier this morning.  Differential diagnosis including ovarian pathology such as torsion or cyst, appendicitis, ectopic pregnancy, UTI, mesenteric adenitis.  Patient denies ever being sexually active and refuses a pelvic exam since she has never had one before.  Labs showing mild leukocytosis but no other abnormalities.  UA is negative for UTI.  Pregnancy test negative.  We will send patient for transabdominal ultrasound to rule out torsion or ovarian pathology.  If that is negative we will send patient for CT to rule out appendicitis.  Clinical Course as of Jun 13 518  Wynelle LinkSun Jun 14, 2019  [id number] US negative for  torsion or ovarian cyst. CT unable to visualize the appendix but showing no surrounding signs of inflammation.  Patient continues to have significant tenderness to palpation over the RLQ with no alternative diagnosis. Clinically concerning for appendicitis.  Will consult surgery.   [CV]  0518 Discussed with Dr. Lady Garyannon from surgery who will evaluate patient.   [CV]    Clinical Course User Index [CV] Don PerkingVeronese, [place], MD      As part of my medical decision making, I reviewed the following data within the electronic MEDICAL RECORD NUMBER Nursing notes reviewed and incorporated, Labs reviewed , Old chart reviewed, Radiograph reviewed , Notes from prior ED visits and [place] Controlled Substance Database   Patient was evaluated in Emergency Department today for the symptoms described in the history of present illness. Patient was evaluated in the context of the global COVID-19 pandemic, which necessitated consideration that the patient might be at risk for infection with the SARS-CoV-2 virus that causes COVID-19. Institutional protocols and algorithms that pertain to the evaluation of patients at risk for COVID-19 are in a state of rapid change based on information released by regulatory bodies including the CDC and federal and state organizations. These policies and algorithms were followed during the patient's care in the ED.  ____________________________________________   FINAL CLINICAL IMPRESSION(S) / ED DIAGNOSES   Final diagnoses:  RLQ abdominal pain      NEW MEDICATIONS STARTED DURING THIS VISIT:  ED Discharge Orders    None       Note:  This document was prepared using Dragon voice recognition software and may include unintentional dictation errors.    Don Perking, [place], MD 06/14/19 4098    Nita Sickle, MD 06/14/19 [phone]

## 2019-06-14 NOTE — Transfer of Care (Signed)
Immediate Anesthesia Transfer of Care Note  Patient: Melissa Santos  Procedure(s) Performed: APPENDECTOMY LAPAROSCOPIC (N/A )  Patient Location: PACU  Anesthesia Type:General  Level of Consciousness: drowsy  Airway & Oxygen Therapy: Patient Spontanous Breathing and Patient connected to nasal cannula oxygen  Post-op Assessment: Report given to RN and Post -op Vital signs reviewed and stable  Post vital signs: Reviewed and stable  Last Vitals:  Vitals Value Taken Time  BP 107/52 06/14/19 1218  Temp 36.4 C 06/14/19 1218  Pulse 66 06/14/19 1219  Resp 17 06/14/19 1219  SpO2 99 % 06/14/19 1219  Vitals shown include unvalidated device data.  Last Pain:  Vitals:   06/14/19 1218  PainSc: Asleep         Complications: No apparent anesthesia complications

## 2019-06-14 NOTE — Progress Notes (Signed)
15 minute call to floor. 

## 2019-06-14 NOTE — Anesthesia Procedure Notes (Signed)
Procedure Name: Intubation Date/Time: 06/14/2019 11:17 AM Performed by: Clinton Sawyer, CRNA Pre-anesthesia Checklist: Patient identified, Emergency Drugs available, Suction available and Patient being monitored Patient Re-evaluated:Patient Re-evaluated prior to induction Oxygen Delivery Method: Circle system utilized Preoxygenation: Pre-oxygenation with 100% oxygen Induction Type: IV induction Ventilation: Mask ventilation without difficulty Laryngoscope Size: Mac and 4 Grade View: Grade I Tube type: Oral Tube size: 6.5 mm Number of attempts: 1 Placement Confirmation: ETT inserted through vocal cords under direct vision,  positive ETCO2 and breath sounds checked- equal and bilateral Secured at: 22 cm Tube secured with: Tape Dental Injury: Teeth and Oropharynx as per pre-operative assessment

## 2019-06-14 NOTE — Anesthesia Postprocedure Evaluation (Signed)
Anesthesia Post Note  Patient: Melissa Santos  Procedure(s) Performed: APPENDECTOMY LAPAROSCOPIC (N/A )  Patient location during evaluation: PACU Anesthesia Type: General Level of consciousness: awake and alert Pain management: pain level controlled Vital Signs Assessment: post-procedure vital signs reviewed and stable Respiratory status: spontaneous breathing, nonlabored ventilation, respiratory function stable and patient connected to nasal cannula oxygen Cardiovascular status: blood pressure returned to baseline and stable Postop Assessment: no apparent nausea or vomiting Anesthetic complications: no     Last Vitals:  Vitals:   06/14/19 1330 06/14/19 1419  BP: 112/70 112/68  Pulse: 67 85  Resp:  17  Temp: 36.5 C 37.2 C  SpO2: 99% 98%    Last Pain:  Vitals:   06/14/19 1436  TempSrc:   PainSc: 4                  Precious Haws Vaughn Beaumier

## 2019-06-14 NOTE — H&P (Signed)
Reason for Consult: Abdominal pain Referring Physician: Nita Sickle, MD (ED)  Melissa Santos is an 18 y.o. female.  HPI: She presented to the emergency department at about 2:00 this morning with abdominal pain.  She reports that she woke up at about 8 AM on Saturday with pain in her right lower quadrant.  She states that it is been constant and predominantly dull, but with intermittent episodes of stabbing pain.  She has been nauseated and had one episode of emesis.  She said that she tried to sleep it off, but when she awoke at 2 AM, her pain was worse.  She states that she has not had any diarrhea, but feels like she is having difficulty with urination over the past 12 hours.  She denies any fevers or chills.  On evaluation in the emergency department, she had a mild elevation in her white blood cell count.  CT scan of the abdomen and pelvis demonstrated that her cecum was deep in the right pelvis and the appendix could not be visualized.  Transvaginal ultrasound revealed normal uterus and ovaries.  General surgery is consulted by Dr. Don Perking in this context to evaluate whether or not this might be acute appendicitis.  On questioning, the patient states that her pain has actually gotten worse while she has been in the emergency department.  Past Medical History:  Diagnosis Date  . Broken collarbone 03/2015    History reviewed. No pertinent surgical history.  History reviewed. No pertinent family history.  Social History:  reports that she has never smoked. She has never used smokeless tobacco. She reports that she does not drink alcohol or use drugs.  Allergies: Not on File  Medications: Prior to Admission: (Not in a hospital admission)   Results for orders placed or performed during the hospital encounter of 06/13/19 (from the past 48 hour(s))  CBC with Differential/Platelet     Status: Abnormal   Collection Time: 06/14/19 12:04 AM  Result Value Ref Range   WBC 11.2 (H) 4.0 -  10.5 K/uL   RBC 4.69 3.87 - 5.11 MIL/uL   Hemoglobin 14.0 12.0 - 15.0 g/dL   HCT 16.1 09.6 - 04.5 %   MCV 87.4 80.0 - 100.0 fL   MCH 29.9 26.0 - 34.0 pg   MCHC 34.1 30.0 - 36.0 g/dL   RDW 40.9 81.1 - 91.4 %   Platelets 250 150 - 400 K/uL   nRBC 0.0 0.0 - 0.2 %   Neutrophils Relative % 72 %   Neutro Abs 7.9 (H) 1.7 - 7.7 K/uL   Lymphocytes Relative 20 %   Lymphs Abs 2.2 0.7 - 4.0 K/uL   Monocytes Relative 6 %   Monocytes Absolute 0.7 0.1 - 1.0 K/uL   Eosinophils Relative 2 %   Eosinophils Absolute 0.2 0.0 - 0.5 K/uL   Basophils Relative 0 %   Basophils Absolute 0.1 0.0 - 0.1 K/uL   Immature Granulocytes 0 %   Abs Immature Granulocytes 0.03 0.00 - 0.07 K/uL    Comment: Performed at [facility], [address] [place], [place] [address]  hCG, quantitative, pregnancy     Status: None   Collection Time: 06/14/19 12:04 AM  Result Value Ref Range   hCG, Beta Chain, Quant, S <1 <5 mIU/mL    Comment:          GEST. AGE      CONC.  (mIU/mL)   <=1 WEEK        5 -  50     2 WEEKS       50 - 500     3 WEEKS       100 - 10,000     4 WEEKS     1,000 - 30,000     5 WEEKS     3,500 - 115,000   6-8 WEEKS     12,000 - 270,000    12 WEEKS     15,000 - 220,000        FEMALE AND NON-PREGNANT FEMALE:     LESS THAN 5 mIU/mL Performed at [facility], [address] [place], [place] [address]   Comprehensive metabolic panel     Status: Abnormal   Collection Time: 06/14/19 12:04 AM  Result Value Ref Range   Sodium 137 135 - 145 mmol/L   Potassium 3.6 3.5 - 5.1 mmol/L   Chloride 103 98 - 111 mmol/L   CO2 24 22 - 32 mmol/L   Glucose, Bld 108 (H) 70 - 99 mg/dL   BUN 8 6 - 20 mg/dL   Creatinine, Ser 6.040.56 0.44 - 1.00 mg/dL   Calcium 9.6 8.9 - 54.010.3 mg/dL   Total Protein 8.0 6.5 - 8.1 g/dL   Albumin 4.1 3.5 - 5.0 g/dL   AST 19 15 - 41 U/L   ALT 12 0 - 44 U/L   Alkaline Phosphatase 85 38 - 126 U/L   Total Bilirubin 0.5 0.3 - 1.2 mg/dL   GFR calc non Af Amer >60 >60 mL/min    GFR calc Af Amer >60 >60 mL/min   Anion gap 10 5 - 15    Comment: Performed at [facility], [address] [place], [place] [address]  Lipase, blood     Status: None   Collection Time: 06/14/19 12:04 AM  Result Value Ref Range   Lipase 24 11 - 51 U/L    Comment: Performed at [facility], [address] [place], [place] [address]  SARS Coronavirus 2 by RT PCR (hospital order, performed in [facility] hospital lab) Nasopharyngeal Nasopharyngeal Swab     Status: None   Collection Time: 06/14/19 12:06 AM   Specimen: Nasopharyngeal Swab  Result Value Ref Range   SARS Coronavirus 2 NEGATIVE NEGATIVE    Comment: (NOTE) If result is NEGATIVE SARS-CoV-2 target nucleic acids are NOT DETECTED. The SARS-CoV-2 RNA is generally detectable in upper and lower  respiratory specimens during the acute phase of infection. The lowest  concentration of SARS-CoV-2 viral copies this assay can detect is 250  copies / mL. A negative result does not preclude SARS-CoV-2 infection  and should not be used as the sole basis for treatment or other  patient management decisions.  A negative result may occur with  improper specimen collection / handling, submission of specimen other  than nasopharyngeal swab, presence of viral mutation(s) within the  areas targeted by this assay, and inadequate number of viral copies  (<250 copies / mL). A negative result must be combined with clinical  observations, patient history, and epidemiological information. If result is POSITIVE SARS-CoV-2 target nucleic acids are DETECTED. The SARS-CoV-2 RNA is generally detectable in upper and lower  respiratory specimens dur ing the acute phase of infection.  Positive  results are indicative of active infection with SARS-CoV-2.  Clinical  correlation with patient history and other diagnostic information is  necessary to determine patient infection status.  Positive results do  not rule out bacterial infection  or co-infection  with other viruses. If result is PRESUMPTIVE POSTIVE SARS-CoV-2 nucleic acids MAY BE PRESENT.   A presumptive positive result was obtained on the submitted specimen  and confirmed on repeat testing.  While 2019 novel coronavirus  (SARS-CoV-2) nucleic acids may be present in the submitted sample  additional confirmatory testing may be necessary for epidemiological  and / or clinical management purposes  to differentiate between  SARS-CoV-2 and other Sarbecovirus currently known to infect humans.  If clinically indicated additional testing with an alternate test  methodology [phone]) is advised. The SARS-CoV-2 RNA is generally  detectable in upper and lower respiratory sp ecimens during the acute  phase of infection. The expected result is Negative. Fact Sheet for Patients:  BoilerBrush.com.cy Fact Sheet for Healthcare Providers: https://pope.com/ This test is not yet approved or cleared by the [place] FDA and has been authorized for detection and/or diagnosis of SARS-CoV-2 by FDA under an Emergency Use Authorization (EUA).  This EUA will remain in effect (meaning this test can be used) for the duration of the COVID-19 declaration under Section 564(b)(1) of the Act, 21 U.S.C. section 360bbb-3(b)(1), unless the authorization is terminated or revoked sooner. Performed at [facility], [address] [place], [place] [address]   Urinalysis, Complete w Microscopic     Status: Abnormal   Collection Time: 06/14/19 12:58 AM  Result Value Ref Range   Color, Urine STRAW (A) YELLOW   APPearance CLEAR (A) CLEAR   Specific Gravity, Urine 1.012 1.005 - 1.030   pH 6.0 5.0 - 8.0   Glucose, UA NEGATIVE NEGATIVE mg/dL   Hgb urine dipstick NEGATIVE NEGATIVE   Bilirubin Urine NEGATIVE NEGATIVE   Ketones, ur NEGATIVE NEGATIVE mg/dL   Protein, ur NEGATIVE NEGATIVE mg/dL   Nitrite NEGATIVE NEGATIVE   Leukocytes,Ua  NEGATIVE NEGATIVE   WBC, UA 0-5 0 - 5 WBC/hpf   Bacteria, UA NONE SEEN NONE SEEN   Squamous Epithelial / LPF 0-5 0 - 5   Mucus PRESENT     Comment: Performed at [facility], [address] [place], [place] [address]    US Pelvis (transabdominal Only)  Result Date: 06/14/2019 CLINICAL DATA:  Right lower quadrant pain EXAM: TRANSABDOMINAL ULTRASOUND OF PELVIS DOPPLER ULTRASOUND OF OVARIES TECHNIQUE: Transabdominal ultrasound examination of the pelvis was performed. Transabdominal technique was performed for global imaging of the pelvis including uterus, ovaries, adnexal regions, and pelvic cul-de-sac. Color and duplex Doppler ultrasound was utilized to evaluate blood flow to the ovaries. COMPARISON:  None. FINDINGS: Uterus Measurements: 6.9 x 2.6 x 3.5 cm = volume: 33.4 mL. No fibroids or other mass visualized. Endometrium Thickness: 4.2 mm.  No focal abnormality visualized. Right ovary Measurements: 2.4 x 0.9 x 1.4 cm = volume: 1.6 mL. Normal appearance/no adnexal mass. Left ovary Measurements: 2.6 x 1.5 x 2.2 cm = volume: 4.5 mL. Normal appearance/no adnexal mass. Pulsed Doppler evaluation of both ovaries demonstrates normal low-resistance arterial and venous waveforms. Other findings No abnormal free fluid. Measurements: 6.9 x 2.6 x 3.5 cm = volume: 33.4 mL. Normal appearance/no adnexal mass. IMPRESSION: Normal exam. Electronically Signed   By: Katherine Mantle M.D.   On: 06/14/2019 03:21   Ct Abdomen Pelvis W Contrast  Result Date: 06/14/2019 CLINICAL DATA:  Abdominal pain since 8 a.m. EXAM: CT ABDOMEN AND PELVIS WITH CONTRAST TECHNIQUE: Multidetector CT imaging of the abdomen and pelvis was performed using the standard protocol following bolus administration of intravenous contrast. CONTRAST:  OMNIPAQUE IOHEXOL 300 MG/ML  SOLN COMPARISON:  None. FINDINGS: Lower chest: Lung bases are clear. Hepatobiliary: No focal hepatic lesion. No biliary duct dilatation. Gallbladder is  normal. Common bile duct is normal. Pancreas: Pancreas is normal. No ductal dilatation. No pancreatic inflammation. Spleen: Normal spleen Adrenals/urinary tract: Adrenal glands and kidneys are normal. The ureters and bladder normal. Stomach/Bowel: Stomach, duodenum small-bowel normal. Cecum extends into the RIGHT pelvis. Appendix difficult to identify but there is no peri cecal inflammation. The ascending, transverse and descending colon normal. Vascular/Lymphatic: Abdominal aorta is normal caliber. No periportal or retroperitoneal adenopathy. No pelvic adenopathy. Reproductive: Uterus and ovaries normal.  No free fluid. Other: No free fluid. Musculoskeletal: No aggressive osseous lesion. IMPRESSION: 1. No acute abdominopelvic findings identified. 2. While the appendix is not clearly identified, no pericecal inflammation present. Cecum extends into the pelvis. Recommend repeat exam if patient's symptoms worsen. Electronically Signed   By: Suzy Bouchard M.D.   On: 06/14/2019 04:22   US Pelvic Doppler (torsion R/o Or Mass Arterial Flow)  Result Date: 06/14/2019 CLINICAL DATA:  Right lower quadrant pain EXAM: TRANSABDOMINAL ULTRASOUND OF PELVIS DOPPLER ULTRASOUND OF OVARIES TECHNIQUE: Transabdominal ultrasound examination of the pelvis was performed. Transabdominal technique was performed for global imaging of the pelvis including uterus, ovaries, adnexal regions, and pelvic cul-de-sac. Color and duplex Doppler ultrasound was utilized to evaluate blood flow to the ovaries. COMPARISON:  None. FINDINGS: Uterus Measurements: 6.9 x 2.6 x 3.5 cm = volume: 33.4 mL. No fibroids or other mass visualized. Endometrium Thickness: 4.2 mm.  No focal abnormality visualized. Right ovary Measurements: 2.4 x 0.9 x 1.4 cm = volume: 1.6 mL. Normal appearance/no adnexal mass. Left ovary Measurements: 2.6 x 1.5 x 2.2 cm = volume: 4.5 mL. Normal appearance/no adnexal mass. Pulsed Doppler evaluation of both ovaries demonstrates  normal low-resistance arterial and venous waveforms. Other findings No abnormal free fluid. Measurements: 6.9 x 2.6 x 3.5 cm = volume: 33.4 mL. Normal appearance/no adnexal mass. IMPRESSION: Normal exam. Electronically Signed   By: Constance Holster M.D.   On: 06/14/2019 03:21    Review of Systems  Constitutional: Positive for malaise/fatigue. Negative for chills and fever.  Gastrointestinal: Positive for abdominal pain, nausea and vomiting.  Genitourinary: Positive for dysuria.  All other systems reviewed and are negative.  Blood pressure 121/76, pulse 89, temperature 98.2 F (36.8 C), resp. rate (!) 25, height 5\' 4"  (1.626 m), weight 65.8 kg, SpO2 99 %. Body mass index is 24.89 kg/m.  Physical Exam  Constitutional: She is oriented to person, place, and time. She appears well-developed and well-nourished.  HENT:  Head: Normocephalic and atraumatic.  Nose and mouth are covered with a mask secondary to COVID-19 precautions  Eyes: Right eye exhibits no discharge. Left eye exhibits no discharge. No scleral icterus.  Neck: No tracheal deviation present.  Cardiovascular: Normal rate and regular rhythm.  Respiratory: Effort normal. No stridor. No respiratory distress.  GI: Soft. There is abdominal tenderness. There is rebound.  She is tender to palpation in the right lower quadrant.  She does have rebound tenderness.  Genitourinary:    Genitourinary Comments: Deferred   Musculoskeletal:        General: No deformity or edema.  Neurological: She is alert and oriented to person, place, and time.  Skin: Skin is warm and dry.  Psychiatric: She has a normal mood and affect.    Assessment/Plan: This is a 18 year old female with approximately 24 hours of right lower quadrant pain.  Gynecologic causes for her pain have been ruled out.  Her  CT scan does show that her cecum is low in the pelvis which may account for her urinary symptoms.  The appendix was not visualized on CT scan but the patient  has not had any prior surgery.  Altogether, the findings are most consistent with acute appendicitis, although other potential etiologies have not been definitively ruled out.  I have recommended that we proceed to the operating room for a laparoscopic appendectomy.  I did caution her that there was a possibility of a negative exploration and finding a normal appendix.  I did tell her that I could look around and see if there was any other potential cause, if I should find a normal appendix.  The risks of the surgery were discussed with her.  These include, but are not limited to, bleeding, infection, negative exploration, damage to surrounding structures, need for drain placement, need for additional interventions.  She is agreed to accept these risks and we will proceed to the operating room, pending OR and anesthesia availability.  Duanne Guess 06/14/2019, 9:00 AM

## 2019-06-14 NOTE — ED Notes (Signed)
Report given to Amber.

## 2019-06-14 NOTE — Op Note (Addendum)
Operative Note  Laparoscopic Appendectomy   Melissa Santos Date of operation:  06/14/2019  Indications: The patient presented with a history of  abdominal pain. Workup has revealed findings consistent with acute appendicitis.  Pre-operative Diagnosis: Appendicitis, unqualified  Post-operative Diagnosis: Same  Surgeon: Fredirick Maudlin, MD  Anesthesia: GETA  Findings: hyperemic appendix deep in the pelvis     Estimated Blood Loss: <5 cc         Specimens: appendix         Complications:  None immediately apparent.  Procedure Details  The patient was seen again in the preop area. The options of surgery versus observation were reviewed with the patient and/or family. The risks of bleeding, infection, recurrence of symptoms, negative laparoscopy, potential for an open procedure, bowel injury, abscess or infection, were all reviewed as well. The patient was taken to Operating Room, identified as Melissa Santos and the procedure verified as laparoscopic appendectomy. A time out was performed and the above information confirmed.  The patient was placed in the supine position and general anesthesia was induced.  Antibiotic prophylaxis was administered and VT E prophylaxis was in place. A Foley catheter was placed by the nursing staff.   The abdomen was prepped and draped in a sterile fashion. An infraumbilical incision was made. A cutdown technique was used to enter the abdominal cavity. Two vicryl stitches were placed on the fascia and a Hasson trocar inserted. Pneumoperitoneum obtained. Two 5 mm ports were placed under direct visualization.  The appendix was identified and found to be acutely inflamed, but without abscess or perforation. The appendix was carefully dissected. The mesoappendix was divided withHarmonic scalpel. The base of the appendix was dissected out and divided with a standard load Endo GIA.The appendix was placed in a Endopouch bag and removed via the Hasson port.  The right lower quadrant and pelvis was then irrigated with normal saline which was then aspirated. The right lower quadrant was inspected there was no sign of bleeding or bowel injury therefore pneumoperitoneum was released, all ports were removed.  The umbilical fascia was closed with 0 Vicryl interrupted sutures and the skin incisions were approximated with subcuticular 4-0 Monocryl. Dermabond was applied The patient tolerated the procedure well and there were no immediately apparent complications. The sponge lap and needle count were correct at the end of the procedure.  The patient was taken to the recovery room in stable condition to be admitted for continued care.   Fredirick Maudlin, MD, FACS

## 2019-06-14 NOTE — Anesthesia Post-op Follow-up Note (Signed)
Anesthesia QCDR form completed.        

## 2019-06-15 ENCOUNTER — Encounter: Payer: Self-pay | Admitting: General Surgery

## 2019-06-15 LAB — HIV ANTIBODY (ROUTINE TESTING W REFLEX): HIV Screen 4th Generation wRfx: NONREACTIVE

## 2019-06-15 MED ORDER — HYDROCODONE-ACETAMINOPHEN 5-325 MG PO TABS
1.0000 | ORAL_TABLET | Freq: Four times a day (QID) | ORAL | Status: DC | PRN
  Administered 2019-06-15: 1 via ORAL
  Filled 2019-06-15: qty 1

## 2019-06-15 MED ORDER — HYDROCODONE-ACETAMINOPHEN 5-325 MG PO TABS: 1.0000 | ORAL_TABLET | Freq: Four times a day (QID) | ORAL | 0 refills | Status: DC | PRN

## 2019-06-15 NOTE — Progress Notes (Signed)
Beola Cord to be D/C'd home per MD order.  Discussed prescriptions and follow up appointments with the patient. Prescriptions given to patient, medication list explained in detail. Pt verbalized understanding.  Allergies as of 06/15/2019   No Known Allergies     Medication List    TAKE these medications   HYDROcodone-acetaminophen 5-325 MG tablet Commonly known as: NORCO/VICODIN Take 1 tablet by mouth every 6 (six) hours as needed for moderate pain.   Junel 1.5/30 1.5-30 MG-MCG tablet Generic drug: Norethindrone Acetate-Ethinyl Estradiol Take 1 tablet by mouth as directed.       Vitals:   06/15/19 0002 06/15/19 0443  BP: 121/63 118/64  Pulse: 79 76  Resp: 20 18  Temp: 98.7 F (37.1 C) 98.2 F (36.8 C)  SpO2: 97% 97%    Skin clean, dry and intact without evidence of skin break down, no evidence of skin tears noted. IV catheter discontinued intact. Site without signs and symptoms of complications. Dressing and pressure applied. Pt denies pain at this time. No complaints noted.  An After Visit Summary was printed and given to the patient. Patient escorted via [place], and D/C home via private auto.  Chuck Hint RN [facility] [employer]

## 2019-06-15 NOTE — Discharge Summary (Addendum)
[facility] SURGICAL DISCHARGE SUMMARY  Patient ID: Melissa Santos MRN: [id number] DOB/AGE: 12/06/2000 18 y.o.  Admit date: 06/13/2019 Discharge date: 06/15/2019  Discharge Diagnoses Patient Active Problem List   Diagnosis Date Noted  . Acute appendicitis 06/14/2019    Consultants None  Procedures 06/14/2019:  Laparoscopic Appendectomy  HPI: Melissa Santos is an 18 y.o. female who presented to the emergency department at about 2:00 this morning (10/10) with abdominal pain.  She reports that she woke up at about 8 AM on Saturday with pain in her right lower quadrant.  She states that it is been constant and predominantly dull, but with intermittent episodes of stabbing pain.  She has been nauseated and had one episode of emesis.  She said that she tried to sleep it off, but when she awoke at 2 AM, her pain was worse.  She states that she has not had any diarrhea, but feels like she is having difficulty with urination over the past 12 hours.  She denies any fevers or chills.  On evaluation in the emergency department, she had a mild elevation in her white blood cell count.  CT scan of the abdomen and pelvis demonstrated that her cecum was deep in the right pelvis and the appendix could not be visualized.  Transvaginal ultrasound revealed normal uterus and ovaries.  General surgery is consulted by Dr. Alfred Levins in this context to evaluate whether or not this might be acute appendicitis.  On questioning, the patient states that her pain has actually gotten worse while she has been in the emergency department.  Hospital Course: Informed consent was obtained and documented, and patient underwent uneventful laparoscopic appendectomy (Dr Celine Ahr, 06/14/2019).  Post-operatively, patient's pain and symptoms improved/resolved and advancement of patient's diet and ambulation were well-tolerated. The remainder of patient's hospital course was essentially unremarkable, and discharge  planning was initiated accordingly with patient safely able to be discharged home with appropriate discharge instructions, pain control, and outpatient follow-up after all of her questions were answered to her expressed satisfaction.   Discharge Condition: good   Physical Examination:  Constitutional: Well appearing female, NAD Pulmonary: Normal effort, no respiratory distress Gastrointestinal: Soft, incisional tenderness, non-distended, no rebound/guarding Skin: Laparoscopic incisions are CDI with steri-strips, no drainage or erythema   Allergies as of 06/15/2019   No Known Allergies     Medication List    TAKE these medications   HYDROcodone-acetaminophen 5-325 MG tablet Commonly known as: NORCO/VICODIN Take 1 tablet by mouth every 6 (six) hours as needed for moderate pain.   Junel 1.5/30 1.5-30 MG-MCG tablet Generic drug: Norethindrone Acetate-Ethinyl Estradiol Take 1 tablet by mouth as directed.        Follow-up Information    Fredirick Maudlin, MD. Schedule an appointment as soon as possible for a visit in 2 week(s).   Specialty: General Surgery Why: s/p lap appy, 2 weeks follow up...okay to see Thedore Mins PA if needed Contact information: [address] [place] [address] [phone]            Time spent on discharge management including discussion of hospital course, clinical condition, outpatient instructions, prescriptions, and follow up with the patient and members of the medical team: >30 minutes  -- Edison Simon , PA-C [facility]  06/15/2019, 10:46 AM [phone] M-F: 7am - 4pm  I saw and evaluated the patient.  I agree with the above documentation, exam, and plan, which I have edited where appropriate. Fredirick Maudlin  10:17 AM

## 2019-06-16 LAB — SURGICAL PATHOLOGY

## 2019-06-30 ENCOUNTER — Encounter: Payer: Self-pay | Admitting: General Surgery

## 2019-07-02 ENCOUNTER — Other Ambulatory Visit: Payer: Self-pay

## 2019-07-02 ENCOUNTER — Ambulatory Visit (INDEPENDENT_AMBULATORY_CARE_PROVIDER_SITE_OTHER): Payer: Self-pay | Admitting: General Surgery

## 2019-07-02 ENCOUNTER — Encounter: Payer: Self-pay | Admitting: General Surgery

## 2019-07-02 VITALS — BP 113/73 | HR 93 | Temp 97.7°F | Resp 14 | Ht 64.0 in | Wt 154.0 lb

## 2019-07-02 DIAGNOSIS — Z9049 Acquired absence of other specified parts of digestive tract: Secondary | ICD-10-CM | POA: Insufficient documentation

## 2019-07-02 NOTE — Progress Notes (Signed)
Melissa Santos is here today for a postoperative visit.  She underwent an uncomplicated laparoscopic appendectomy on June 14, 2019.  She has done well in the postoperative period.  Her pain has been well controlled.  She still has some mild fatigue, but this is improving.  She denies nausea or vomiting.  No fevers or chills.  Appetite is good and bowel movements are normal.  She does report some irritation around the suprapubic port site.  She has been treating this topically with Neosporin and applying a Band-Aid to prevent rubbing on her clothing.  Today's Vitals   07/02/19 0936  BP: 113/73  Pulse: 93  Resp: 14  Temp: 97.7 F (36.5 C)  TempSrc: Temporal  SpO2: 96%  Weight: 154 lb (69.9 kg)  Height: 5\' 4"  (1.626 m)  PainSc: 0-No pain   Body mass index is 26.43 kg/m. Focused abdominal exam: All of the laparoscopic port sites appear to be healing well.  There is no induration, erythema, or purulent drainage present.  There is some redness around the suprapubic site that is in the shape of an adhesive bandage.  Impression and plan: This is a 18 year old woman who underwent an uncomplicated laparoscopic appendectomy.  She is doing well from a postoperative perspective.  She does have some local skin irritation around the suprapubic site that seems more likely to be related to the adhesive from her Band-Aid.  I have suggested she try alternate methods of preventing her close from rubbing in the area.  She inquired as to whether or not a topical scar application would be permitted.  She provided the box for my inspection.  It is an over-the-counter silicone cream.  She may use this as desired.  For now, she has no ongoing surgical needs and we will see her on an as-needed basis.

## 2019-07-02 NOTE — Patient Instructions (Signed)

## 2019-11-13 ENCOUNTER — Ambulatory Visit: Payer: BC Managed Care – PPO | Attending: Internal Medicine

## 2019-11-13 DIAGNOSIS — Z23 Encounter for immunization: Secondary | ICD-10-CM

## 2019-11-13 NOTE — Progress Notes (Signed)
   Covid-19 Vaccination Clinic  Name:  CARMALETA YOUNGERS    MRN: [id number] DOB: [birth year]  11/13/2019  Ms. Hardiman was observed post Covid-19 immunization for 15 minutes without incident. She was provided with Vaccine Information Sheet and instruction to access the V-Safe system.   Ms. Pardi was instructed to call 911 with any severe reactions post vaccine: Marland Kitchen Difficulty breathing  . Swelling of face and throat  . A fast heartbeat  . A bad rash all over body  . Dizziness and weakness   Immunizations Administered    Name Date Dose VIS Date Route   Pfizer COVID-19 Vaccine 11/13/2019  4:44 PM 0.3 mL 08/14/2019 Intramuscular   Manufacturer: [employer], [employer]   Lot: [id number]   NDC: [address]-4

## 2019-12-07 ENCOUNTER — Ambulatory Visit: Payer: BC Managed Care – PPO | Attending: Internal Medicine

## 2019-12-07 ENCOUNTER — Ambulatory Visit: Payer: BC Managed Care – PPO

## 2019-12-07 DIAGNOSIS — Z23 Encounter for immunization: Secondary | ICD-10-CM

## 2019-12-07 NOTE — Progress Notes (Signed)
   Covid-19 Vaccination Clinic  Name:  Melissa Santos    MRN: [id number] DOB: [birth year]  12/07/2019  [name] was observed post Covid-19 immunization for 15 minutes without incident. She was provided with Vaccine Information Sheet and instruction to access the V-Safe system.   [name] was instructed to call 911 with any severe reactions post vaccine: Marland Kitchen Difficulty breathing  . Swelling of face and throat  . A fast heartbeat  . A bad rash all over body  . Dizziness and weakness   Immunizations Administered    Name Date Dose VIS Date Route   Pfizer COVID-19 Vaccine 12/07/2019 11:15 AM 0.3 mL 08/14/2019 Intramuscular   Manufacturer: [employer], [employer]   Lot: [id number]   NDC: [address]-3

## 2021-05-23 ENCOUNTER — Encounter: Payer: Self-pay | Admitting: General Surgery
